# Patient Record
Sex: Male | Born: 1985
Health system: Southern US, Community
[De-identification: ages and names within clinical notes are randomized; demographics above are authoritative.]

---
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
## 2017-12-22 DIAGNOSIS — S060X0A Concussion without loss of consciousness, initial encounter: Secondary | ICD-10-CM | POA: Diagnosis not present

## 2017-12-22 DIAGNOSIS — S01511A Laceration without foreign body of lip, initial encounter: Secondary | ICD-10-CM | POA: Diagnosis not present

## 2018-04-06 DIAGNOSIS — D485 Neoplasm of uncertain behavior of skin: Secondary | ICD-10-CM | POA: Diagnosis not present

## 2018-05-04 DIAGNOSIS — D485 Neoplasm of uncertain behavior of skin: Secondary | ICD-10-CM | POA: Diagnosis not present

## 2018-05-14 DIAGNOSIS — L720 Epidermal cyst: Secondary | ICD-10-CM | POA: Diagnosis not present

## 2022-01-28 ENCOUNTER — Ambulatory Visit
Admission: EM | Admit: 2022-01-28 | Discharge: 2022-01-28 | Disposition: A | Discharge status: Home / Self Care | Payer: Managed Care, Other (non HMO) | Attending: Physician Assistant | Admitting: Physician Assistant

## 2022-01-28 ENCOUNTER — Other Ambulatory Visit: Payer: Self-pay

## 2022-01-28 DIAGNOSIS — J069 Acute upper respiratory infection, unspecified: Secondary | ICD-10-CM

## 2022-01-28 MED ORDER — DOXYCYCLINE HYCLATE 100 MG PO CAPS: 100.0000 mg | ORAL_CAPSULE | Freq: Two times a day (BID) | ORAL | 0 refills | Status: DC

## 2022-01-28 NOTE — ED Provider Notes (Signed)
?[facility] ? ? ? ?CSN: [id number] ?Arrival date & time: 01/28/22  9562 ? ? ?  ? ?History   ?Chief Complaint ?Chief Complaint  ?Patient presents with  ? Nasal Congestion  ? ? ?HPI ?Paul Boyle is a 36 y.o. male.  ? ?Patient here today for evaluation of sinus pressure that started 5 days ago with seem to worsen with time.  He notes that he started to have a cough a few days ago.  He reports that overall he does not feel well.  He has been taking Mucinex without significant relief.  He denies any nausea, vomiting or diarrhea. ? ?The history is provided by the patient.  ? ?History reviewed. No pertinent past medical history. ? ?There are no problems to display for this patient. ? ? ?History reviewed. No pertinent surgical history. ? ? ? ? ?Home Medications   ? ?Prior to Admission medications   ?Medication Sig Start Date End Date Taking? Authorizing Provider  ?doxycycline (VIBRAMYCIN) 100 MG capsule Take 1 capsule (100 mg total) by mouth 2 (two) times daily. 01/28/22  Yes Francene Finders, PA-C  ? ? ?Family History ?Family History  ?Problem Relation Age of Onset  ? Healthy Mother   ? Diabetes Father   ? ? ?Social History ?Social History  ? ?Tobacco Use  ? Smoking status: Every Day  ?  Types: Cigarettes  ? Smokeless tobacco: Current  ?  Types: Chew, Snuff  ? ? ? ?Allergies   ?Augmentin [amoxicillin-pot clavulanate] ? ? ?Review of Systems ?Review of Systems  ?Constitutional:  Negative for chills and fever.  ?HENT:  Positive for congestion, rhinorrhea and sore throat. Negative for ear pain.   ?Eyes:  Negative for discharge and redness.  ?Respiratory:  Positive for cough. Negative for shortness of breath.   ?Gastrointestinal:  Negative for abdominal pain, nausea and vomiting.  ? ? ?Physical Exam ?Triage Vital Signs ?ED Triage Vitals  ?Enc Vitals Group  ?   BP   ?   Pulse   ?   Resp   ?   Temp   ?   Temp src   ?   SpO2   ?   Weight   ?   Height   ?   Head Circumference   ?   Peak Flow   ?   Pain Score   ?    Pain Loc   ?   Pain Edu?   ?   Excl. in [place]?   ? ?No data found. ? ?Updated Vital Signs ?BP 127/82 (BP Location: Left Arm)   Pulse 62   Temp (!) 97.4 ?F (36.3 ?C) (Oral)   Resp 18   SpO2 95%  ?   ? ?Physical Exam ?Vitals and nursing note reviewed.  ?Constitutional:   ?   General: He is not in acute distress. ?   Appearance: Normal appearance. He is not ill-appearing.  ?HENT:  ?   Head: Normocephalic and atraumatic.  ?   Nose: Congestion present.  ?Eyes:  ?   Conjunctiva/sclera: Conjunctivae normal.  ?Cardiovascular:  ?   Rate and Rhythm: Normal rate and regular rhythm.  ?   Heart sounds: Normal heart sounds. No murmur heard. ?Pulmonary:  ?   Effort: Pulmonary effort is normal. No respiratory distress.  ?   Breath sounds: Normal breath sounds. No wheezing, rhonchi or rales.  ?Skin: ?   General: Skin is warm and dry.  ?Neurological:  ?   Mental Status: He is  alert.  ?Psychiatric:     ?   Mood and Affect: Mood normal.     ?   Thought Content: Thought content normal.  ? ? ? ?UC Treatments / Results  ?Labs ?(all labs ordered are listed, but only abnormal results are displayed) ?Labs Reviewed  ?NOVEL CORONAVIRUS, NAA  ? ? ?EKG ? ? ?Radiology ?No results found. ? ?Procedures ?Procedures (including critical care time) ? ?Medications Ordered in UC ?Medications - No data to display ? ?Initial Impression / Assessment and Plan / UC Course  ?I have reviewed the triage vital signs and the nursing notes. ? ?Pertinent labs & imaging results that were available during my care of the patient were reviewed by me and considered in my medical decision making (see chart for details). ? ?  ?Will treat to cover sinusitis given worsening symptoms but will also screen for covid. Encouraged symptomatic treatment and rest. Recommend follow up with any further concerns.  ? ?Final Clinical Impressions(s) / UC Diagnoses  ? ?Final diagnoses:  ?Acute upper respiratory infection  ? ?Discharge Instructions   ?None ?  ? ?ED Prescriptions   ? ?  Medication Sig Dispense Auth. Provider  ? doxycycline (VIBRAMYCIN) 100 MG capsule Take 1 capsule (100 mg total) by mouth 2 (two) times daily. 20 capsule Francene Finders, PA-C  ? ?  ? ?PDMP not reviewed this encounter. ?  ?Francene Finders, PA-C ?01/28/22 [phone] ? ?

## 2022-01-28 NOTE — ED Triage Notes (Signed)
5 day h/o sinus pressure, cough, runny nose and congestion that has worsened since the onset. No v/d. ?Has been taking mucinex. Also,took two leftover tamiflu w/relief.  ?

## 2022-01-30 LAB — NOVEL CORONAVIRUS, NAA: SARS-CoV-2, NAA: NOT DETECTED

## 2023-08-27 ENCOUNTER — Encounter: Payer: Self-pay | Admitting: Emergency Medicine

## 2023-08-27 ENCOUNTER — Other Ambulatory Visit: Payer: Self-pay

## 2023-08-27 ENCOUNTER — Emergency Department
Admission: EM | Admit: 2023-08-27 | Discharge: 2023-08-27 | Disposition: A | Discharge status: Home / Self Care | Payer: Commercial Managed Care - PPO | Attending: Emergency Medicine | Admitting: Emergency Medicine

## 2023-08-27 ENCOUNTER — Emergency Department: Payer: Commercial Managed Care - PPO

## 2023-08-27 DIAGNOSIS — K610 Anal abscess: Secondary | ICD-10-CM | POA: Insufficient documentation

## 2023-08-27 LAB — CBC WITH DIFFERENTIAL/PLATELET
Abs Immature Granulocytes: 0.01 10*3/uL (ref 0.00–0.07)
Basophils Absolute: 0.1 10*3/uL (ref 0.0–0.1)
Basophils Relative: 1 %
Eosinophils Absolute: 0.4 10*3/uL (ref 0.0–0.5)
Eosinophils Relative: 5 %
HCT: 44.9 % (ref 39.0–52.0)
Hemoglobin: 15.7 g/dL (ref 13.0–17.0)
Immature Granulocytes: 0 %
Lymphocytes Relative: 20 %
Lymphs Abs: 1.6 10*3/uL (ref 0.7–4.0)
MCH: 31.5 pg (ref 26.0–34.0)
MCHC: 35 g/dL (ref 30.0–36.0)
MCV: 90 fL (ref 80.0–100.0)
Monocytes Absolute: 0.7 10*3/uL (ref 0.1–1.0)
Monocytes Relative: 9 %
Neutro Abs: 5.3 10*3/uL (ref 1.7–7.7)
Neutrophils Relative %: 65 %
Platelets: 277 10*3/uL (ref 150–400)
RBC: 4.99 MIL/uL (ref 4.22–5.81)
RDW: 11.8 % (ref 11.5–15.5)
WBC: 8.1 10*3/uL (ref 4.0–10.5)
nRBC: 0 % (ref 0.0–0.2)

## 2023-08-27 LAB — COMPREHENSIVE METABOLIC PANEL
ALT: 37 U/L (ref 0–44)
AST: 28 U/L (ref 15–41)
Albumin: 4.4 g/dL (ref 3.5–5.0)
Alkaline Phosphatase: 105 U/L (ref 38–126)
Anion gap: 10 (ref 5–15)
BUN: 14 mg/dL (ref 6–20)
CO2: 26 mmol/L (ref 22–32)
Calcium: 9 mg/dL (ref 8.9–10.3)
Chloride: 102 mmol/L (ref 98–111)
Creatinine, Ser: 0.87 mg/dL (ref 0.61–1.24)
GFR, Estimated: >60 mL/min (ref >60)
Glucose, Bld: 97 mg/dL (ref 70–99)
Potassium: 4.3 mmol/L (ref 3.5–5.1)
Sodium: 138 mmol/L (ref 135–145)
Total Bilirubin: 0.7 mg/dL (ref 0.3–1.2)
Total Protein: 7.6 g/dL (ref 6.5–8.1)

## 2023-08-27 MED ORDER — ONDANSETRON HCL 4 MG/2ML IJ SOLN
4.0000 mg | Freq: Once | INTRAMUSCULAR | Status: AC
  Administered 2023-08-27: 4 mg via INTRAVENOUS
  Filled 2023-08-27: qty 2

## 2023-08-27 MED ORDER — DOXYCYCLINE HYCLATE 100 MG PO TABS: 100.0000 mg | ORAL_TABLET | Freq: Two times a day (BID) | ORAL | 0 refills | Status: AC

## 2023-08-27 MED ORDER — IOHEXOL 300 MG/ML  SOLN
80.0000 mL | Freq: Once | INTRAMUSCULAR | Status: AC | PRN
  Administered 2023-08-27: 80 mL via INTRAVENOUS

## 2023-08-27 MED ORDER — MORPHINE SULFATE (PF) 4 MG/ML IV SOLN
4.0000 mg | Freq: Once | INTRAVENOUS | Status: AC
  Administered 2023-08-27: 4 mg via INTRAVENOUS
  Filled 2023-08-27: qty 1

## 2023-08-27 NOTE — ED Notes (Signed)
Pt to CT

## 2023-08-27 NOTE — ED Provider Notes (Signed)
[facility] Provider Note    Event Date/Time   First MD Initiated Contact with Patient 08/27/23 1126     (approximate)   History   Abscess   HPI  Paul Boyle is a 37 y.o. 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  Patient had surgery as a found to fistulas on MRI.  When the surgeon did surgery at [facility] they only saw 1 fistula to repair.  States he is unsure if this is now a recurrent problem.  Area has been very sore for several days.  Now has noticed swelling.  No fever or chills.  No chest pain or shortness of breath.  No difficulty with urination.  Patient surgery was in 2021      Physical Exam   Triage Vital Signs: ED Triage Vitals  Encounter Vitals Group     BP 08/27/23 1115 133/76     Systolic BP Percentile --      Diastolic BP Percentile --      Pulse Rate 08/27/23 1115 77     Resp 08/27/23 1115 17     Temp 08/27/23 1115 98.7 F (37.1 C)     Temp Source 08/27/23 1115 Oral     SpO2 08/27/23 1115 97 %     Weight 08/27/23 1116 170 lb (77.1 kg)     Height 08/27/23 1116 5\' 8"  (1.727 m)     Head Circumference --      Peak Flow --      Pain Score 08/27/23 1116 8     Pain Loc --      Pain Education --      Exclude from Growth Chart --     Most recent vital signs: Vitals:   08/27/23 1115  BP: 133/76  Pulse: 77  Resp: 17  Temp: 98.7 F (37.1 C)  SpO2: 97%     General: Awake, no distress.   CV:  Good peripheral perfusion. regular rate and  rhythm Resp:  Normal effort.  Abd:  No distention.  Nontender Other:  Rectal exam shows scarring on the left side of the rectum from previous surgery, very tender indurated area appears to feel deeper than superficial on the right side of the rectum, no drainage, no hemorrhoid noted, fissure noted   ED Results / Procedures / Treatments   Labs (all labs ordered are listed, but only abnormal results  are displayed) Labs Reviewed  COMPREHENSIVE METABOLIC PANEL  CBC WITH DIFFERENTIAL/PLATELET     EKG     RADIOLOGY CT abdomen pelvis IV contrast    PROCEDURES:   Procedures   MEDICATIONS ORDERED IN ED: Medications  morphine (PF) 4 MG/ML injection 4 mg (4 mg Intravenous Given 08/27/23 1231)  ondansetron (ZOFRAN) injection 4 mg (4 mg Intravenous Given 08/27/23 1230)  iohexol (OMNIPAQUE) 300 MG/ML solution 80 mL (80 mLs Intravenous Contrast Given 08/27/23 1332)     IMPRESSION / MDM / ASSESSMENT AND PLAN / ED COURSE  I reviewed the triage vital signs and the nursing notes.                              Differential diagnosis includes, but is not limited to, rectal abscess, fistula, colitis, mass  Patient's presentation is most consistent with acute illness / injury with system symptoms.   Due to the patient's history along with now recurrent symptoms and signs  of an abscess we will do CT abdomen pelvis with IV contrast.  Labs ordered, patient was given morphine 4 mg IV and Zofran 4 mg IV for pain. Labs are reassuring   CT abdomen pelvis independently reviewed interpreted by me as being positive for very small anal 1 cm abscess  In shared decision-making with the patient.  Will defer incision and drainage at this time.  Will try warm water soaks along with doxycycline.  Follow-up with surgery if not improving in 2 to 3 days.  Of course return to emergency department if worsening.  Did offer the patient pain medication.  He is refusing pain medication at this time.  Discharged in stable condition.   FINAL CLINICAL IMPRESSION(S) / ED DIAGNOSES   Final diagnoses:  Anal abscess     Rx / DC Orders   ED Discharge Orders          Ordered    doxycycline (VIBRA-TABS) 100 MG tablet  2 times daily        08/27/23 1447             Note:  This document was prepared using Dragon voice recognition software and may include unintentional dictation errors.    Faythe Ghee, PA-C 08/27/23 1449    Loleta Rose, MD 08/27/23 2114

## 2023-08-27 NOTE — ED Triage Notes (Signed)
Pt sts that he has a peri rectal abscess. Pt sts that he has had rectal fissures in the past and has had corrective surgery on them however one of them was not able to be fixed.

## 2023-08-27 NOTE — ED Notes (Signed)
Pt back from CT. Call light within reach. Given remote for TV.

## 2023-08-27 NOTE — Discharge Instructions (Signed)
Soak in a warm tub of water with epsom salt at least 2 times a day  Return to the ER if worsening
